# Patient Record
Sex: Male | Born: 1960 | Race: White | Hispanic: No | Marital: Single | Smoking: Never smoker
Health system: Southern US, Community
[De-identification: ages and names within clinical notes are randomized; demographics above are authoritative.]

## PROBLEM LIST (undated history)

## (undated) DIAGNOSIS — C6290 Malignant neoplasm of unspecified testis, unspecified whether descended or undescended: Secondary | ICD-10-CM

## (undated) HISTORY — DX: Malignant neoplasm of unspecified testis, unspecified whether descended or undescended: C62.90

## (undated) HISTORY — PX: TESTICLE SURGERY: SHX794

---

## 2002-01-08 ENCOUNTER — Ambulatory Visit: Admission: RE | Admit: 2002-01-08 | Discharge: 2002-04-08 | Payer: Self-pay | Admitting: Radiation Oncology

## 2011-11-15 ENCOUNTER — Encounter: Payer: Self-pay | Admitting: Gastroenterology

## 2011-11-23 ENCOUNTER — Other Ambulatory Visit (INDEPENDENT_AMBULATORY_CARE_PROVIDER_SITE_OTHER): Payer: 59

## 2011-11-23 ENCOUNTER — Ambulatory Visit (INDEPENDENT_AMBULATORY_CARE_PROVIDER_SITE_OTHER): Payer: 59 | Admitting: Gastroenterology

## 2011-11-23 ENCOUNTER — Encounter: Payer: Self-pay | Admitting: Gastroenterology

## 2011-11-23 DIAGNOSIS — R143 Flatulence: Secondary | ICD-10-CM

## 2011-11-23 DIAGNOSIS — R141 Gas pain: Secondary | ICD-10-CM

## 2011-11-23 DIAGNOSIS — R142 Eructation: Secondary | ICD-10-CM

## 2011-11-23 DIAGNOSIS — Z8547 Personal history of malignant neoplasm of testis: Secondary | ICD-10-CM

## 2011-11-23 DIAGNOSIS — K5900 Constipation, unspecified: Secondary | ICD-10-CM

## 2011-11-23 DIAGNOSIS — R140 Abdominal distension (gaseous): Secondary | ICD-10-CM | POA: Insufficient documentation

## 2011-11-23 LAB — BASIC METABOLIC PANEL
BUN: 14 mg/dL (ref 6–23)
Chloride: 101 mEq/L (ref 96–112)
Creatinine, Ser: 1.2 mg/dL (ref 0.4–1.5)
GFR: 67.63 mL/min (ref >60.00)
Glucose, Bld: 99 mg/dL (ref 70–99)
Potassium: 4.3 mEq/L (ref 3.5–5.1)

## 2011-11-23 LAB — FOLATE: Folate: 6.3 ng/mL (ref >5.9)

## 2011-11-23 LAB — CBC WITH DIFFERENTIAL/PLATELET
Basophils Absolute: 0 10*3/uL (ref 0.0–0.1)
Eosinophils Absolute: 0.3 10*3/uL (ref 0.0–0.7)
Lymphocytes Relative: 14.5 % (ref 12.0–46.0)
MCHC: 35.1 g/dL (ref 30.0–36.0)
Monocytes Relative: 7.3 % (ref 3.0–12.0)
Neutrophils Relative %: 73.7 % (ref 43.0–77.0)
RDW: 13.7 % (ref 11.5–14.6)

## 2011-11-23 LAB — HEPATIC FUNCTION PANEL
AST: 20 U/L (ref 0–37)
Albumin: 4.1 g/dL (ref 3.5–5.2)
Alkaline Phosphatase: 73 U/L (ref 39–117)
Total Protein: 7.2 g/dL (ref 6.0–8.3)

## 2011-11-23 LAB — TSH: TSH: 3.02 u[IU]/mL (ref 0.35–5.50)

## 2011-11-23 LAB — IBC PANEL
Iron: 93 ug/dL (ref 42–165)
Transferrin: 293.9 mg/dL (ref 212.0–360.0)

## 2011-11-23 LAB — VITAMIN B12: Vitamin B-12: 242 pg/mL (ref 211–911)

## 2011-11-23 MED ORDER — PEG-KCL-NACL-NASULF-NA ASC-C 100 G PO SOLR: 1.0000 | Freq: Once | ORAL | Status: DC

## 2011-11-23 NOTE — Progress Notes (Signed)
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 He had no problems with his gastrointestinal tract during his radiation therapy. He currently describes mild constipation relieved with MiraLax and some postprandial gas and bloating with vague abdominal discomfort. He denies any upper gastrointestinal or hepatobiliary complaints. He specifically denies melena, hematochezia, anorexia, weight loss, or any systemic complaints. He has regular followup with urology, and has a family history of prostate cancer. He denies current genitourinary symptomatology. The patient has not had previous colonoscopy or barium enema.  I have reviewed this patient's present history, medical and surgical past history, allergies and medications.     ROS: The remainder of the 10 point ROS is negative     Physical Exam: General well developed well nourished patient in no acute distress, appearing his day-to-day Eyes PERRLA, no icterus, fundoscopic exam per opthamologist Skin no lesions noted Neck supple, no adenopathy, no thyroid enlargement, no tenderness Chest clear to percussion and auscultation Heart no significant murmurs, gallops or rubs noted Abdomen no hepatosplenomegaly masses or tenderness, BS normal. . Extremities no acute joint lesions, edema, phlebitis or evidence of cellulitis. Neurologic patient oriented x 3, cranial nerves intact, no focal neurologic deficits noted. Psychological mental status normal and normal affect.  Assessment and plan: Probable diverticulosis coli, rule out colonic polyposis per his age. I placed him on a high-fiber diet with daily Metamucil and liberal by mouth fluids. Screening labs ordered, and we will schedule outpatient colonoscopy. Information concerning diverticulosis in his management given to the patient. His symptoms do not seem consistent with chronic radiation injury to his  gut, but he may need abdominal CT scan depending on his workup and clinical course.  Encounter Diagnoses  Name Primary?  . Bloating   . Constipation

## 2011-11-23 NOTE — Patient Instructions (Signed)
Your procedure has been scheduled for 12/13/2011, please follow the seperate instructions.  Please go to the basement today for your labs.  Your prescription(s) have been sent to you pharmacy.  Buy metamucil OTC and take once a day. Follow the high fiber diet below.     High Fiber Diet A high fiber diet changes your normal diet to include more whole grains, legumes, fruits, and vegetables. Changes in the diet involve replacing refined carbohydrates with unrefined foods. The calorie level of the diet is essentially unchanged. The Dietary Reference Intake (recommended amount) for adult males is 38 g per day. For adult females, it is 25 g per day. Pregnant and lactating women should consume 28 g of fiber per day. Fiber is the intact part of a plant that is not broken down during digestion. Functional fiber is fiber that has been isolated from the plant to provide a beneficial effect in the body. PURPOSE  Increase stool bulk.   Ease and regulate bowel movements.   Lower cholesterol.  INDICATIONS THAT YOU NEED MORE FIBER  Constipation and hemorrhoids.   Uncomplicated diverticulosis (intestine condition) and irritable bowel syndrome.   Weight management.   As a protective measure against hardening of the arteries (atherosclerosis), diabetes, and cancer.  NOTE OF CAUTION If you have a digestive or bowel problem, ask your caregiver for advice before adding high fiber foods to your diet. Some of the following medical problems are such that a high fiber diet should not be used without consulting your caregiver:  Acute diverticulitis (intestine infection).   Partial small bowel obstructions.   Complicated diverticular disease involving bleeding, rupture (perforation), or abscess (boil, furuncle).   Presence of autonomic neuropathy (nerve damage) or gastric paresis (stomach cannot empty itself).  GUIDELINES FOR INCREASING FIBER  Start adding fiber to the diet slowly. A gradual increase of  about 5 more grams (2 slices of whole-wheat bread, 2 servings of most fruits or vegetables, or 1 bowl of high fiber cereal) per day is best. Too rapid an increase in fiber may result in constipation, flatulence, and bloating.   Drink enough water and fluids to keep your urine clear or pale yellow. Water, juice, or caffeine-free drinks are recommended. Not drinking enough fluid may cause constipation.   Eat a variety of high fiber foods rather than one type of fiber.   Try to increase your intake of fiber through using high fiber foods rather than fiber pills or supplements that contain small amounts of fiber.   The goal is to change the types of food eaten. Do not supplement your present diet with high fiber foods, but replace foods in your present diet.  INCLUDE A VARIETY OF FIBER SOURCES  Replace refined and processed grains with whole grains, canned fruits with fresh fruits, and incorporate other fiber sources. White rice, white breads, and most bakery goods contain little or no fiber.   Brown whole-grain rice, buckwheat oats, and many fruits and vegetables are all good sources of fiber. These include: broccoli, Brussels sprouts, cabbage, cauliflower, beets, sweet potatoes, white potatoes (skin on), carrots, tomatoes, eggplant, squash, berries, fresh fruits, and dried fruits.   Cereals appear to be the richest source of fiber. Cereal fiber is found in whole grains and bran. Bran is the fiber-rich outer coat of cereal grain, which is largely removed in refining. In whole-grain cereals, the bran remains. In breakfast cereals, the largest amount of fiber is found in those with "bran" in their names. The fiber content is  sometimes indicated on the label.   You may need to include additional fruits and vegetables each day.   In baking, for 1 cup white flour, you may use the following substitutions:   1 cup whole-wheat flour minus 2 tbs.    cup white flour plus  cup whole-wheat flour.    Document Released: 10/24/2005 Document Revised: 07/06/2011 Document Reviewed: 09/01/2009 [facility] Patient Information 2012 [place] [facility] [place], [place].   Diverticulitis A diverticulum is a small pouch or sac on the colon. Diverticulosis is the presence of these diverticula on the colon. Diverticulitis is the irritation (inflammation) or infection of diverticula. CAUSES  The colon and its diverticula contain bacteria. If food particles block the tiny opening to a diverticulum, the bacteria inside can grow and cause an increase in pressure. This leads to infection and inflammation and is called diverticulitis. SYMPTOMS   Abdominal pain and tenderness. Usually, the pain is located on the left side of your abdomen. However, it could be located elsewhere.   Fever.   Bloating.   Feeling sick to your stomach (nausea).   Throwing up (vomiting).   Abnormal stools.  DIAGNOSIS  Your caregiver will take a history and perform a physical exam. Since many things can cause abdominal pain, other tests may be necessary. Tests may include:  Blood tests.   Urine tests.   X-ray of the abdomen.   CT scan of the abdomen.  Sometimes, surgery is needed to determine if diverticulitis or other conditions are causing your symptoms. TREATMENT  Most of the time, you can be treated without surgery. Treatment includes:  Resting the bowels by only having liquids for a few days. As you improve, you will need to eat a low-fiber diet.   Intravenous (IV) fluids if you are losing body fluids (dehydrated).   Antibiotic medicines that treat infections may be given.   Pain and nausea medicine, if needed.   Surgery if the inflamed diverticulum has burst.  HOME CARE INSTRUCTIONS   Try a clear liquid diet (broth, tea, or water for as long as directed by your caregiver). You may then gradually begin a low-fiber diet as tolerated. A low-fiber diet is a diet with less than 10 grams of fiber. Choose the foods below to reduce  fiber in the diet:   White breads, cereals, rice, and pasta.   Cooked fruits and vegetables or soft fresh fruits and vegetables without the skin.   Ground or well-cooked tender beef, ham, veal, lamb, pork, or poultry.   Eggs and seafood.   After your diverticulitis symptoms have improved, your caregiver may put you on a high-fiber diet. A high-fiber diet includes 14 grams of fiber for every 1000 calories consumed. For a standard 2000 calorie diet, you would need 28 grams of fiber. Follow these diet guidelines to help you increase the fiber in your diet. It is important to slowly increase the amount fiber in your diet to avoid gas, constipation, and bloating.   Choose whole-grain breads, cereals, pasta, and brown rice.   Choose fresh fruits and vegetables with the skin on. Do not overcook vegetables because the more vegetables are cooked, the more fiber is lost.   Choose more nuts, seeds, legumes, dried peas, beans, and lentils.   Look for food products that have greater than 3 grams of fiber per serving on the Nutrition Facts label.   Take all medicine as directed by your caregiver.   If your caregiver has given you a follow-up appointment, it is very important that  you go. Not going could result in lasting (chronic) or permanent injury, pain, and disability. If there is any problem keeping the appointment, call to reschedule.  SEEK MEDICAL CARE IF:   Your pain does not improve.   You have a hard time advancing your diet beyond clear liquids.   Your bowel movements do not return to normal.  SEEK IMMEDIATE MEDICAL CARE IF:   Your pain becomes worse.   You have an oral temperature above 102 F (38.9 C), not controlled by medicine.   You have repeated vomiting.   You have bloody or black, tarry stools.   Symptoms that brought you to your caregiver become worse or are not getting better.  MAKE SURE YOU:   Understand these instructions.   Will watch your condition.   Will  get help right away if you are not doing well or get worse.  Document Released: 08/03/2005 Document Revised: 07/06/2011 Document Reviewed: 11/29/2010 [facility] Patient Information 2012 [place], [place].

## 2011-11-24 LAB — FERRITIN: Ferritin: 132.9 ng/mL (ref 22.0–322.0)

## 2011-12-12 ENCOUNTER — Telehealth: Payer: Self-pay | Admitting: *Deleted

## 2011-12-12 NOTE — Telephone Encounter (Signed)
Message copied by Leonette Monarch on Mon Dec 12, 2011  1:38 PM ------      Message from: Jarold Motto, DAVID R      Created: Mon Dec 12, 2011 12:37 PM       Start B12 rx.Marland KitchenMarland Kitchen

## 2011-12-13 ENCOUNTER — Ambulatory Visit (AMBULATORY_SURGERY_CENTER): Payer: 59 | Admitting: Gastroenterology

## 2011-12-13 ENCOUNTER — Encounter: Payer: Self-pay | Admitting: Gastroenterology

## 2011-12-13 ENCOUNTER — Other Ambulatory Visit: Payer: Self-pay | Admitting: Gastroenterology

## 2011-12-13 DIAGNOSIS — D128 Benign neoplasm of rectum: Secondary | ICD-10-CM

## 2011-12-13 DIAGNOSIS — Z1211 Encounter for screening for malignant neoplasm of colon: Secondary | ICD-10-CM

## 2011-12-13 DIAGNOSIS — R143 Flatulence: Secondary | ICD-10-CM

## 2011-12-13 DIAGNOSIS — K5900 Constipation, unspecified: Secondary | ICD-10-CM

## 2011-12-13 DIAGNOSIS — R141 Gas pain: Secondary | ICD-10-CM

## 2011-12-13 DIAGNOSIS — D126 Benign neoplasm of colon, unspecified: Secondary | ICD-10-CM

## 2011-12-13 DIAGNOSIS — E538 Deficiency of other specified B group vitamins: Secondary | ICD-10-CM

## 2011-12-13 DIAGNOSIS — D129 Benign neoplasm of anus and anal canal: Secondary | ICD-10-CM

## 2011-12-13 DIAGNOSIS — K5730 Diverticulosis of large intestine without perforation or abscess without bleeding: Secondary | ICD-10-CM

## 2011-12-13 DIAGNOSIS — R140 Abdominal distension (gaseous): Secondary | ICD-10-CM

## 2011-12-13 MED ORDER — CYANOCOBALAMIN 1000 MCG/ML IJ SOLN
1000.0000 ug | INTRAMUSCULAR | Status: AC
  Administered 2011-12-15 – 2011-12-29 (×3): 1000 ug via INTRAMUSCULAR

## 2011-12-13 MED ORDER — SODIUM CHLORIDE 0.9 % IV SOLN: 500.0000 mL | INTRAVENOUS | Status: DC

## 2011-12-13 NOTE — Patient Instructions (Signed)
Please read the handouts given to you by your recovery room nurse.   You need to increase the fiber in your diet.  Your polyp result will be mailed to your home within two weeks.    You may resume your routine medications today.   If you have any questions, call at [phone].  Thanlk-you.

## 2011-12-13 NOTE — Telephone Encounter (Signed)
Pt aware.

## 2011-12-13 NOTE — Op Note (Signed)
[facility] [address] [place], [place]  [address]  COLONOSCOPY PROCEDURE REPORT  PATIENT:  Seth Greer, Seth Greer  MR#:  [id number] BIRTHDATE:  1961-07-12, 50 yrs. old  GENDER:  male ENDOSCOPIST:  Vania Rea. Jarold Motto, MD, [facility] REF. BY:  Evelena Peat, M.D. PROCEDURE DATE:  12/13/2011 PROCEDURE:  Colonoscopy with biopsy ASA CLASS:  Class I INDICATIONS:  Routine Risk Screening, constipation MEDICATIONS:   propofol (Diprivan) 300 mg IV  DESCRIPTION OF PROCEDURE:   After the risks and benefits and of the procedure were explained, informed consent was obtained. Digital rectal exam was performed and revealed no abnormalities. The LB CF-[id number] endoscope was introduced through the anus and advanced to the cecum, which was identified by both the appendix and ileocecal valve.  The quality of the prep was excellent, using MoviPrep.  The instrument was then slowly withdrawn as the colon was fully examined. <<PROCEDUREIMAGES>>  FINDINGS:  A diminutive polyp was found at the appendiceal orifice. SMALL NODULE BIOPSIED AT APPENDICEAL OEIFICE AREA. Scattered diverticula were found DESCENDING & SIGMOID COLON  This was otherwise a normal examination of the colon.   Retroflexed views in the rectum revealed no abnormalities.    The scope was then withdrawn from the patient and the procedure completed.  COMPLICATIONS:  None ENDOSCOPIC IMPRESSION: 1) Diminutive polyp at the appendiceal orifice 2) Diverticula, scattered in the DESCENDING & SIGMOID COLON 3) Otherwise normal examination R/O CECAL ADENOMA VSNORMAL TISSUE. RECOMMENDATIONS: 1) High fiber diet 2) metamucil or benefiber 3) Repeat colonoscopy in 5 years if polyp adenomatous; otherwise 10 years  REPEAT EXAM:  No  ______________________________ Vania Rea. Jarold Motto, MD, [facility]  CC:  n. eSIGNED:   Vania Rea. Patterson at 12/13/2011 03:04 PM  Hopple, Sharlot Gowda, [id number]

## 2011-12-13 NOTE — Progress Notes (Signed)
Patient did not have preoperative order for IV antibiotic SSI prophylaxis. (G8918)  Patient did not experience any of the following events: a burn prior to discharge; a fall within the facility; wrong site/side/patient/procedure/implant event; or a hospital transfer or hospital admission upon discharge from the facility. (G8907)  

## 2011-12-14 ENCOUNTER — Telehealth: Payer: Self-pay

## 2011-12-14 DIAGNOSIS — Z1211 Encounter for screening for malignant neoplasm of colon: Secondary | ICD-10-CM | POA: Insufficient documentation

## 2011-12-14 NOTE — Telephone Encounter (Signed)
Left message on answering machine. 

## 2011-12-15 ENCOUNTER — Ambulatory Visit (INDEPENDENT_AMBULATORY_CARE_PROVIDER_SITE_OTHER): Payer: 59 | Admitting: Gastroenterology

## 2011-12-15 DIAGNOSIS — E538 Deficiency of other specified B group vitamins: Secondary | ICD-10-CM

## 2011-12-19 ENCOUNTER — Encounter: Payer: Self-pay | Admitting: Gastroenterology

## 2011-12-22 ENCOUNTER — Ambulatory Visit (INDEPENDENT_AMBULATORY_CARE_PROVIDER_SITE_OTHER): Payer: 59 | Admitting: Gastroenterology

## 2011-12-22 DIAGNOSIS — E538 Deficiency of other specified B group vitamins: Secondary | ICD-10-CM

## 2011-12-22 MED ORDER — ZOLPIDEM TARTRATE 10 MG PO TABS: 10.0000 mg | ORAL_TABLET | Freq: Every evening | ORAL | Status: AC | PRN

## 2011-12-22 NOTE — Progress Notes (Signed)
Per Dr Jarold Motto one month Seth Greer can be sent then he needs to get it from his primary care MD

## 2011-12-29 ENCOUNTER — Ambulatory Visit (INDEPENDENT_AMBULATORY_CARE_PROVIDER_SITE_OTHER): Payer: 59 | Admitting: Gastroenterology

## 2011-12-29 DIAGNOSIS — E538 Deficiency of other specified B group vitamins: Secondary | ICD-10-CM

## 2012-01-18 ENCOUNTER — Other Ambulatory Visit: Payer: Self-pay | Admitting: Gastroenterology

## 2012-01-18 DIAGNOSIS — E538 Deficiency of other specified B group vitamins: Secondary | ICD-10-CM

## 2012-01-18 MED ORDER — CYANOCOBALAMIN 1000 MCG/ML IJ SOLN
1000.0000 ug | INTRAMUSCULAR | Status: DC
  Administered 2012-01-26 – 2012-05-29 (×5): 1000 ug via INTRAMUSCULAR

## 2012-01-26 ENCOUNTER — Ambulatory Visit (INDEPENDENT_AMBULATORY_CARE_PROVIDER_SITE_OTHER): Payer: 59 | Admitting: Gastroenterology

## 2012-01-26 DIAGNOSIS — E538 Deficiency of other specified B group vitamins: Secondary | ICD-10-CM

## 2012-02-21 ENCOUNTER — Ambulatory Visit (INDEPENDENT_AMBULATORY_CARE_PROVIDER_SITE_OTHER): Payer: 59 | Admitting: Gastroenterology

## 2012-02-21 DIAGNOSIS — E538 Deficiency of other specified B group vitamins: Secondary | ICD-10-CM

## 2012-03-23 ENCOUNTER — Ambulatory Visit (INDEPENDENT_AMBULATORY_CARE_PROVIDER_SITE_OTHER): Payer: 59 | Admitting: Gastroenterology

## 2012-03-23 DIAGNOSIS — E538 Deficiency of other specified B group vitamins: Secondary | ICD-10-CM

## 2012-04-26 ENCOUNTER — Ambulatory Visit (INDEPENDENT_AMBULATORY_CARE_PROVIDER_SITE_OTHER): Payer: 59 | Admitting: Gastroenterology

## 2012-04-26 DIAGNOSIS — E538 Deficiency of other specified B group vitamins: Secondary | ICD-10-CM

## 2012-05-29 ENCOUNTER — Ambulatory Visit (INDEPENDENT_AMBULATORY_CARE_PROVIDER_SITE_OTHER): Payer: 59 | Admitting: Gastroenterology

## 2012-05-29 DIAGNOSIS — E538 Deficiency of other specified B group vitamins: Secondary | ICD-10-CM

## 2012-06-29 ENCOUNTER — Encounter: Payer: 59 | Admitting: Gastroenterology

## 2012-07-30 ENCOUNTER — Ambulatory Visit (INDEPENDENT_AMBULATORY_CARE_PROVIDER_SITE_OTHER): Payer: 59 | Admitting: Gastroenterology

## 2012-07-30 DIAGNOSIS — E538 Deficiency of other specified B group vitamins: Secondary | ICD-10-CM

## 2012-07-30 MED ORDER — CYANOCOBALAMIN 1000 MCG/ML IJ SOLN
1000.0000 ug | Freq: Once | INTRAMUSCULAR | Status: AC
  Administered 2012-07-30: 1000 ug via INTRAMUSCULAR

## 2012-08-30 ENCOUNTER — Ambulatory Visit (INDEPENDENT_AMBULATORY_CARE_PROVIDER_SITE_OTHER): Payer: 59 | Admitting: Gastroenterology

## 2012-08-30 DIAGNOSIS — E538 Deficiency of other specified B group vitamins: Secondary | ICD-10-CM

## 2012-08-30 MED ORDER — CYANOCOBALAMIN 1000 MCG/ML IJ SOLN
1000.0000 ug | Freq: Once | INTRAMUSCULAR | Status: AC
  Administered 2012-10-02: 1000 ug via INTRAMUSCULAR

## 2012-10-02 ENCOUNTER — Ambulatory Visit (INDEPENDENT_AMBULATORY_CARE_PROVIDER_SITE_OTHER): Payer: 59 | Admitting: Gastroenterology

## 2012-10-02 ENCOUNTER — Other Ambulatory Visit: Payer: Self-pay | Admitting: Gastroenterology

## 2012-10-02 DIAGNOSIS — E538 Deficiency of other specified B group vitamins: Secondary | ICD-10-CM

## 2012-11-02 ENCOUNTER — Ambulatory Visit (INDEPENDENT_AMBULATORY_CARE_PROVIDER_SITE_OTHER): Payer: 59 | Admitting: Gastroenterology

## 2012-11-02 ENCOUNTER — Telehealth: Payer: Self-pay | Admitting: Gastroenterology

## 2012-11-02 DIAGNOSIS — E538 Deficiency of other specified B group vitamins: Secondary | ICD-10-CM

## 2012-11-02 MED ORDER — CYANOCOBALAMIN 1000 MCG/ML IJ SOLN: 1000.0000 ug | Freq: Once | INTRAMUSCULAR | Status: AC

## 2012-11-02 NOTE — Telephone Encounter (Signed)
Informed pt I calculate his last injection will be 12/2012; I will call him to have labs drawn in March, 2014; pt stated understanding.

## 2012-12-07 ENCOUNTER — Telehealth: Payer: Self-pay | Admitting: *Deleted

## 2012-12-07 ENCOUNTER — Ambulatory Visit (INDEPENDENT_AMBULATORY_CARE_PROVIDER_SITE_OTHER): Payer: 59 | Admitting: Gastroenterology

## 2012-12-07 DIAGNOSIS — E538 Deficiency of other specified B group vitamins: Secondary | ICD-10-CM

## 2012-12-07 DIAGNOSIS — Z923 Personal history of irradiation: Secondary | ICD-10-CM

## 2012-12-07 DIAGNOSIS — Z8547 Personal history of malignant neoplasm of testis: Secondary | ICD-10-CM

## 2012-12-07 MED ORDER — CYANOCOBALAMIN 1000 MCG/ML IJ SOLN
1000.0000 ug | Freq: Once | INTRAMUSCULAR | Status: AC
  Administered 2012-12-07: 1000 ug via INTRAMUSCULAR

## 2012-12-07 NOTE — Progress Notes (Signed)
Patient reminded to come in March for his Vitamin B-12 level to be drawn.  He is requesting we draw all labs like done last year.  I will forward this to Aram Beecham, RN to see if we may add these.

## 2012-12-07 NOTE — Telephone Encounter (Signed)
Labs are ok to be done   ----- Message -----   From: Linna Hoff, RN   Sent: 12/07/2012 1:49 PM   To: Mardella Layman, MD      Dr Jarold Motto saw you 1//16/13 and then had COLON showing hyperplastic polyp. Hx of testicular cancer with radiation and he's been receiving B12 from [place].   Pt is due a B12 level in march and he would like all his labs repeated: B12, BMET, TSH, Hepatic Panel, Ferritin, CBC, Folate, IBC Panel.   Pt would like these repeated; OK? Thanks.    ----- Message -----   From: Patti E [place], CMA   Sent: 12/07/2012 11:09 AM   To: Linna Hoff, RN      Pt requesting lab orders be put in to "check everything" like done last year, coming in March for Vit B-12 level to be drawn.         Informed pt that Dr Jarold Motto OK'd him to have the labs repeated. Pt stated understanding and will come in March.      Attached Reports     The sender attached the following reports to this message:

## 2013-01-08 ENCOUNTER — Other Ambulatory Visit (INDEPENDENT_AMBULATORY_CARE_PROVIDER_SITE_OTHER): Payer: 59

## 2013-01-08 DIAGNOSIS — Z923 Personal history of irradiation: Secondary | ICD-10-CM

## 2013-01-08 DIAGNOSIS — Z8547 Personal history of malignant neoplasm of testis: Secondary | ICD-10-CM

## 2013-01-08 DIAGNOSIS — E538 Deficiency of other specified B group vitamins: Secondary | ICD-10-CM

## 2013-01-08 DIAGNOSIS — Z5189 Encounter for other specified aftercare: Secondary | ICD-10-CM

## 2013-01-08 LAB — IBC PANEL
Iron: 119 ug/dL (ref 42–165)
Saturation Ratios: 32.3 % (ref 20.0–50.0)
Transferrin: 263.4 mg/dL (ref 212.0–360.0)

## 2013-01-08 LAB — HEPATIC FUNCTION PANEL
Albumin: 4 g/dL (ref 3.5–5.2)
Total Protein: 7.7 g/dL (ref 6.0–8.3)

## 2013-01-08 LAB — CBC WITH DIFFERENTIAL/PLATELET
Basophils Absolute: 0 10*3/uL (ref 0.0–0.1)
Basophils Relative: 0.3 % (ref 0.0–3.0)
Eosinophils Absolute: 0.1 10*3/uL (ref 0.0–0.7)
Hemoglobin: 15.9 g/dL (ref 13.0–17.0)
Lymphocytes Relative: 14.3 % (ref 12.0–46.0)
Monocytes Relative: 4.8 % (ref 3.0–12.0)
Neutro Abs: 5.3 10*3/uL (ref 1.4–7.7)
Neutrophils Relative %: 78.5 % — ABNORMAL HIGH (ref 43.0–77.0)
RBC: 4.88 Mil/uL (ref 4.22–5.81)
RDW: 13.3 % (ref 11.5–14.6)

## 2013-01-08 LAB — BASIC METABOLIC PANEL
BUN: 11 mg/dL (ref 6–23)
Creatinine, Ser: 1.1 mg/dL (ref 0.4–1.5)
GFR: 71.44 mL/min (ref >60.00)
Glucose, Bld: 150 mg/dL — ABNORMAL HIGH (ref 70–99)

## 2013-01-08 LAB — TSH: TSH: 1.75 u[IU]/mL (ref 0.35–5.50)

## 2013-01-08 LAB — FERRITIN: Ferritin: 175.3 ng/mL (ref 22.0–322.0)

## 2013-01-14 ENCOUNTER — Encounter: Payer: Self-pay | Admitting: Gastroenterology

## 2013-01-14 ENCOUNTER — Ambulatory Visit (INDEPENDENT_AMBULATORY_CARE_PROVIDER_SITE_OTHER)
Admission: RE | Admit: 2013-01-14 | Discharge: 2013-01-14 | Disposition: A | Discharge status: Home / Self Care | Payer: 59 | Source: Ambulatory Visit | Attending: Gastroenterology | Admitting: Gastroenterology

## 2013-01-14 ENCOUNTER — Ambulatory Visit
Admission: RE | Admit: 2013-01-14 | Discharge: 2013-01-14 | Disposition: A | Discharge status: Home / Self Care | Payer: 59 | Source: Ambulatory Visit | Attending: Gastroenterology | Admitting: Gastroenterology

## 2013-01-14 ENCOUNTER — Ambulatory Visit (INDEPENDENT_AMBULATORY_CARE_PROVIDER_SITE_OTHER)
Admission: RE | Admit: 2013-01-14 | Discharge: 2013-01-14 | Disposition: A | Discharge status: Home / Self Care | Payer: 59 | Source: Ambulatory Visit

## 2013-01-14 ENCOUNTER — Other Ambulatory Visit (INDEPENDENT_AMBULATORY_CARE_PROVIDER_SITE_OTHER): Payer: 59

## 2013-01-14 ENCOUNTER — Ambulatory Visit (INDEPENDENT_AMBULATORY_CARE_PROVIDER_SITE_OTHER): Payer: 59 | Admitting: Gastroenterology

## 2013-01-14 VITALS — BP 108/78 | HR 75 | Ht 74.5 in | Wt 197.1 lb

## 2013-01-14 DIAGNOSIS — Z8547 Personal history of malignant neoplasm of testis: Secondary | ICD-10-CM

## 2013-01-14 DIAGNOSIS — R109 Unspecified abdominal pain: Secondary | ICD-10-CM

## 2013-01-14 DIAGNOSIS — R079 Chest pain, unspecified: Secondary | ICD-10-CM

## 2013-01-14 MED ORDER — CELECOXIB 200 MG PO CAPS: 200.0000 mg | ORAL_CAPSULE | Freq: Every day | ORAL | Status: DC

## 2013-01-14 MED ORDER — IOHEXOL 300 MG/ML  SOLN
100.0000 mL | Freq: Once | INTRAMUSCULAR | Status: AC | PRN
  Administered 2013-01-14: 100 mL via INTRAVENOUS

## 2013-01-14 NOTE — Progress Notes (Signed)
This is a  52 year old Caucasian male who continues with crampy diffuse abdominal pain, also pain in his right posterior chest area without real precipitating or alleviating elements.  His chest pain is made worse by sitting in one position for extensive periods.  Recent colonoscopy was unremarkable except for mild diverticulosis.  He is status post surgery and radiation therapy for testicular cancer in 2003.  His mild constipation is improved with MiraLax but he continues with gas, bloating, and crampy abdominal pain without specific food intolerances.  There is been no anorexia, weight loss, fever or chills.  Lab data was all unremarkable except for slightly elevated blood sugar of 150.  He denies any cardiopulmonary or other general medical or genitourinary symptoms.  Current Medications, Allergies, Past Medical History, Past Surgical History, Family History and Social History were reviewed in [employer] record.  ROS: All systems were reviewed and are negative unless otherwise stated in the HPI.          Physical Exam: Blood pressure 130/78, pulse 75 and regular, and weight under 97 with a BMI of 24.98.  I cannot appreciate stigmata of chronic liver disease.  His chest is clear to P&A, he is in a regular rhythm without murmurs gallops or rubs, and there is no abdominal distention, organomegaly, masses or tenderness.  Bowel sounds are normal.  Mental status is normal.    Assessment and Plan: Right posterior chest pain ; probably musculoskeletal in nature.  His abdominal pain is most consistent with irritable bowel syndrome.  I have ordered a hemoglobin A1c, and go ahead with the CT scan of his chest and abdomen per his history of testicular cancer and previous radiation therapy.  He is currently on a probiotic trial.  I prescribed Celebrex 200 mg a day pending further evaluation.  In the interim, I have asked him to continue a high-fiber diet as tolerated, liberal by mouth  fluids, and when necessary MiraLax.  Encounter Diagnoses  Name Primary?  . H/O testicular cancer Yes  . Abdominal  pain, other specified site   . Chest pain, unspecified

## 2013-01-14 NOTE — Patient Instructions (Addendum)
You have been scheduled for a CT scan of the abdomen and pelvis at [facility] ([address]---this is in the same building as [facility]).   You are scheduled on 01-14-2013 at 4 PM. You should arrive 15 minutes prior to your appointment time for registration. Please follow the written instructions below on the day of your exam:  WARNING: IF YOU ARE ALLERGIC TO IODINE/X-RAY DYE, PLEASE NOTIFY RADIOLOGY IMMEDIATELY AT [phone]! YOU WILL BE GIVEN A 13 HOUR PREMEDICATION PREP.  1) Do not eat or drink anything after 12 PM (4 hours prior to your test) 2) You have been given 2 bottles of oral contrast to drink. The solution may taste better if refrigerated, but do NOT add ice or any other liquid to this solution. Shake well before drinking.    Drink 1 bottle of contrast @ 2PM (2 hours prior to your exam)  Drink 1 bottle of contrast @ 3PM (1 hour prior to your exam)  You may take any medications as prescribed with a small amount of water except for the following: Metformin, Glucophage, Glucovance, Avandamet, Riomet, Fortamet, Actoplus Met, Janumet, Glumetza or Metaglip. The above medications must be held the day of the exam AND 48 hours after the exam.  The purpose of you drinking the oral contrast is to aid in the visualization of your intestinal tract. The contrast solution may cause some diarrhea. Before your exam is started, you will be given a small amount of fluid to drink. Depending on your individual set of symptoms, you may also receive an intravenous injection of x-ray contrast/dye. Plan on being at [facility] for 30 minutes or long, depending on the type of exam you are having performed.  This test typically takes 30-45 minutes to complete.  If you have any questions regarding your exam or if you need to reschedule, you may call the CT department at [phone] between the hours of 8:00 am and 5:00 pm,  Monday-Friday.  _____________________________________________________________________________________________________________________  Your physician has requested that you go to the basement for the following lab work before leaving today: HgB A1c   We have sent the following medications to your pharmacy for you to pick up at your convenience: Celebrex, please take as directed

## 2013-01-14 NOTE — Addendum Note (Signed)
Addended by: Ok Anis A on: 01/14/2013 12:05 PM   Modules accepted: Orders

## 2013-01-15 ENCOUNTER — Telehealth: Payer: Self-pay | Admitting: *Deleted

## 2013-01-15 DIAGNOSIS — R911 Solitary pulmonary nodule: Secondary | ICD-10-CM

## 2013-01-15 NOTE — Telephone Encounter (Signed)
Informed pt of results of CT and Dr Jarold Motto and the need for a pulmonary referral; I will call him tomorrow with the appt. Pt states 01/22/13 is good for him because he travels.

## 2013-01-16 NOTE — Telephone Encounter (Signed)
Informed pt of his appt with Dr Craige Cotta on 01/31/13 at 3pm; gave him his number in case he has to r/s. Pt asked what to do about his abdominal pain. He states he has not tried the Celebrex; he will try that and if his pain continues, he will call me back. He did receive the email of his scan.

## 2013-01-31 ENCOUNTER — Encounter: Payer: Self-pay | Admitting: Pulmonary Disease

## 2013-01-31 ENCOUNTER — Ambulatory Visit (INDEPENDENT_AMBULATORY_CARE_PROVIDER_SITE_OTHER): Payer: 59 | Admitting: Pulmonary Disease

## 2013-01-31 VITALS — BP 120/70 | HR 94 | Temp 98.3°F | Ht 74.75 in | Wt 201.2 lb

## 2013-01-31 DIAGNOSIS — R911 Solitary pulmonary nodule: Secondary | ICD-10-CM | POA: Insufficient documentation

## 2013-01-31 NOTE — Progress Notes (Signed)
  Subjective:    Patient ID: Seth Greer, male    DOB: Apr 23, 1961, 52 y.o.   MRN: [id number]  HPI    Review of Systems  Constitutional: Negative for fever and unexpected weight change.  HENT: Positive for congestion. Negative for ear pain, nosebleeds, sore throat, rhinorrhea, sneezing, trouble swallowing, dental problem, postnasal drip and sinus pressure.   Eyes: Negative for redness and itching.  Respiratory: Negative for cough, chest tightness, shortness of breath and wheezing.   Cardiovascular: Negative for palpitations and leg swelling.  Gastrointestinal: Positive for abdominal pain. Negative for nausea and vomiting.  Genitourinary: Negative for dysuria.  Musculoskeletal: Negative for joint swelling.  Skin: Negative for rash.  Neurological: Negative for headaches.  Hematological: Does not bruise/bleed easily.  Psychiatric/Behavioral: Negative for dysphoric mood. The patient is not nervous/anxious.        Objective:   Physical Exam        Assessment & Plan:

## 2013-01-31 NOTE — Patient Instructions (Signed)
Will schedule CT chest for September 2014 Follow up after CT chest in September 2014

## 2013-01-31 NOTE — Progress Notes (Signed)
Chief Complaint  Patient presents with  . Advice Only    Ref By Dr.Patterson-- CT scan done after having stomach pains-- spots found on lungs-- denies any sx    History of Present Illness: Seth Greer is a 52 y.o. male for evaluation of lung nodule.  He was diagnosed with testicular cancer in 2003.  He had surgery and prophylactic XRT to his mid chest.  He is followed by Dr. Charlsie Merles with [facility].  His last CT chest was in 2005 or 2006.  He has been followed by Dr. Jarold Motto for his GI issues.  During this evaluation he had CT chest and abdomen.  He was found to have small RML nodule, and referred to pulmonary for further assessment.  He has never smoked, and denies 2nd hand tobacco exposure.  He denies occupational exposure.  He is from [place].  There is no history of pneumonia or exposure to tuberculosis.  He denies animal exposures.  He denies cough, wheeze, sputum, hemoptysis, chest pain, fever, sweats, weight loss, joint swelling, or skin rashes. He does not feel like his breathing causes any problems.  Tests: CT chest 01/08/13 >> 4 mm RML nodule  Seth Greer  has a past medical history of Testicular cancer.  Seth Greer  has past surgical history that includes Testicle surgery.  Prior to Admission medications   Medication Sig Start Date End Date Taking? Authorizing Provider  diphenhydrAMINE (BENADRYL) 25 mg capsule Take 25 mg by mouth at bedtime as needed for itching.   Yes Historical Provider, MD  ibuprofen (ADVIL,MOTRIN) 200 MG tablet Take 200 mg by mouth as needed.   Yes Historical Provider, MD  Probiotic Product (PROBIOTIC FORMULA PO) Take 1 tablet by mouth daily.   Yes Historical Provider, MD  zolpidem (AMBIEN) 10 MG tablet Take 1 tablet (10 mg total) by mouth at bedtime as needed for sleep. 12/22/11 01/14/13  Mardella Layman, MD    No Known Allergies  family history includes Ovarian cancer in his mother and Prostate cancer in his father.   reports  that he has never smoked. He has never used smokeless tobacco. He reports that he drinks about 7.2 ounces of alcohol per week. He reports that he does not use illicit drugs.   Physical Exam:  General - No distress ENT - No sinus tenderness, no oral exudate, no LAN, no thyromegaly, TM clear, pupils equal/reactive Cardiac - s1s2 regular, no murmur, pulses symmetric Chest - No wheeze/rales/dullness, good air entry, normal respiratory excursion Back - No focal tenderness Abd - Soft, non-tender, no organomegaly, + bowel sounds Ext - No edema Neuro - Normal strength, cranial nerves intact Skin - No rashes Psych - Normal mood, and behavior.  01/14/2013  *RADIOLOGY REPORT*   Clinical Data:  Mid abdominal pain.  Chest pain.  History of testicular cancer.   CT CHEST WITH CONTRAST  Technique:  Multidetector CT imaging of the chest, abdomen and pelvis was performed following the standard protocol during bolus administration of intravenous contrast.  Contrast: OMNIPAQUE IOHEXOL 300 MG/ML  SOLN,   Comparison:   None.   Findings:  No pathologic thoracic adenopathy.  Cardiac contours normal.  Thoracic aorta and branch vessels unremarkable.  Mild biapical pleuroparenchymal scarring noted.  4 mm right middle lobe nodule, image 42 of series 3.  Mild thoracic spondylosis noted.   IMPRESSION:  1.  4 mm right middle lobe pulmonary nodule.  Fleischner guidelines do not apply due to the history  of testicular cancer.  Although likely benign, CT of the chest follow-up in 6-12 months may be warranted to ensure lack of change.   Lab Results  Component Value Date   WBC 6.8 01/08/2013   HGB 15.9 01/08/2013   HCT 45.3 01/08/2013   MCV 92.9 01/08/2013   PLT 234.0 01/08/2013    Lab Results  Component Value Date   CREATININE 1.1 01/08/2013   BUN 11 01/08/2013   NA 139 01/08/2013   K 4.3 01/08/2013   CL 102 01/08/2013   CO2 29 01/08/2013    Lab Results  Component Value Date   ALT 20 01/08/2013   AST 18 01/08/2013   ALKPHOS  68 01/08/2013   BILITOT 1.3* 01/08/2013    Lab Results  Component Value Date   TSH 1.75 01/08/2013    Assessment/Plan:  Coralyn Helling, MD [facility]/Sleep Pager:  [phone] 01/31/2013, 3:18 PM

## 2013-01-31 NOTE — Assessment & Plan Note (Signed)
He has incidental finding of small lung nodule in RML.  My suspicion for malignancy is low given no history of smoking.  However, he does have history of testicular cancer.  Will arrange for repeat non contrast CT chest for September 2014, and follow up after this.

## 2013-07-09 ENCOUNTER — Telehealth: Payer: Self-pay | Admitting: Pulmonary Disease

## 2013-07-09 ENCOUNTER — Ambulatory Visit (INDEPENDENT_AMBULATORY_CARE_PROVIDER_SITE_OTHER)
Admission: RE | Admit: 2013-07-09 | Discharge: 2013-07-09 | Disposition: A | Discharge status: Home / Self Care | Payer: 59 | Source: Ambulatory Visit | Attending: Pulmonary Disease | Admitting: Pulmonary Disease

## 2013-07-09 DIAGNOSIS — R911 Solitary pulmonary nodule: Secondary | ICD-10-CM

## 2013-07-09 NOTE — Telephone Encounter (Signed)
Ct Chest Wo Contrast  07/09/2013    CLINICAL DATA:  Six-month followup of lung nodules. History of testicular cancer. Dry cough.   EXAM: CT CHEST WITHOUT CONTRAST   TECHNIQUE: Multidetector CT imaging of the chest was performed following the standard protocol without IV contrast.   COMPARISON:  01/14/2013   FINDINGS:  Stable 0.4 x 0.3 cm right middle lobe pulmonary nodule is noncalcified. No additional nodules observed. Minimal biapical pleural parenchymal scarring. No thoracic adenopathy or additional significant findings.   IMPRESSION:  1. 3 x 4 mm right middle lobe pulmonary nodule, unchanged from 01/14/2013. This documents 6 months of stability. This lesion is statistically highly likely to be benign. Given the history of testicular malignancy, CT chest without contrast an 12-18 months should be considered to firmly established long-term stability. Alternatively, if the patient has remote imaging through this region which could be provided, we are happy to compare for stability.    Electronically Signed   By: Herbie Baltimore   On: 07/09/2013 14:29    Will have my nurse inform pt that CT chest results are stable, and he needs next available ROV to review results in more detail.

## 2013-07-10 NOTE — Telephone Encounter (Signed)
LMTCB x 1 

## 2013-07-10 NOTE — Telephone Encounter (Signed)
I spoke with patient about results and he verbalized understanding and had no questions appt scheduled 

## 2013-07-10 NOTE — Telephone Encounter (Signed)
Pt returned Lindsay's call.  Antionette Fairy

## 2013-07-11 ENCOUNTER — Ambulatory Visit (INDEPENDENT_AMBULATORY_CARE_PROVIDER_SITE_OTHER): Payer: 59 | Admitting: Pulmonary Disease

## 2013-07-11 ENCOUNTER — Encounter: Payer: Self-pay | Admitting: Pulmonary Disease

## 2013-07-11 VITALS — BP 132/88 | HR 79 | Temp 97.1°F | Ht 75.0 in | Wt 208.0 lb

## 2013-07-11 DIAGNOSIS — J4520 Mild intermittent asthma, uncomplicated: Secondary | ICD-10-CM | POA: Insufficient documentation

## 2013-07-11 DIAGNOSIS — R911 Solitary pulmonary nodule: Secondary | ICD-10-CM

## 2013-07-11 DIAGNOSIS — R05 Cough: Secondary | ICD-10-CM

## 2013-07-11 NOTE — Assessment & Plan Note (Signed)
He has recent development of cough likely related to allergies.  He is to continue OTC anti-histamines.  Advised him to call if his symptoms progress.

## 2013-07-11 NOTE — Progress Notes (Signed)
Chief Complaint  Patient presents with  . Follow-up    Review CT results    History of Present Illness: Seth Greer is a 52 y.o. male never smoker with pulmonary nodule and hx of testicular cancer.  He is here to review CT chest.  He was doing well until one week ago.  He was exposed to air conditioner and then got sinus congestion with post nasal drip.  He then developed a dry cough.  He denies sputum fever, sinus pain, chest pain, chest tightness, gland swelling, or skin rash.  He has been using benadryl at night and this helps.  TESTS: CT chest 01/08/13 >> 4 mm RML nodule CT chest 07/09/13 >> no change RML nodule  Seth Greer  has a past medical history of Testicular cancer.  Seth Greer  has past surgical history that includes Testicle surgery.  Prior to Admission medications   Medication Sig Start Date End Date Taking? Authorizing Provider  diphenhydrAMINE (BENADRYL) 25 mg capsule Take 25 mg by mouth at bedtime as needed for itching.   Yes Historical Provider, MD  ibuprofen (ADVIL,MOTRIN) 200 MG tablet Take 200 mg by mouth as needed.   Yes Historical Provider, MD  Probiotic Product (PROBIOTIC FORMULA PO) Take 1 tablet by mouth daily.   Yes Historical Provider, MD  zolpidem (AMBIEN) 10 MG tablet Take 1 tablet (10 mg total) by mouth at bedtime as needed for sleep. 12/22/11 07/11/13 Yes Mardella Layman, MD    No Known Allergies   Physical Exam:  General - No distress ENT - No sinus tenderness, no oral exudate, no LAN Cardiac - s1s2 regular, no murmur Chest - No wheeze/rales/dullness Back - No focal tenderness Abd - Soft, non-tender Ext - No edema Neuro - Normal strength Skin - No rashes Psych - normal mood, and behavior  Ct Chest Wo Contrast  07/09/2013    CLINICAL DATA:  Six-month followup of lung nodules. History of testicular cancer. Dry cough.   EXAM: CT CHEST WITHOUT CONTRAST   TECHNIQUE: Multidetector CT imaging of the chest was performed following the standard  protocol without IV contrast.   COMPARISON:  01/14/2013   FINDINGS:  Stable 0.4 x 0.3 cm right middle lobe pulmonary nodule is noncalcified. No additional nodules observed. Minimal biapical pleural parenchymal scarring. No thoracic adenopathy or additional significant findings.   IMPRESSION:  1. 3 x 4 mm right middle lobe pulmonary nodule, unchanged from 01/14/2013. This documents 6 months of stability. This lesion is statistically highly likely to be benign. Given the history of testicular malignancy, CT chest without contrast an 12-18 months should be considered to firmly established long-term stability. Alternatively, if the patient has remote imaging through this region which could be provided, we are happy to compare for stability.    Electronically Signed   By: Herbie Baltimore   On: 07/09/2013 14:29   Assessment/Plan:  Coralyn Helling, MD [facility]/Sleep Pager:  [phone]

## 2013-07-11 NOTE — Assessment & Plan Note (Signed)
Radiographic appearance is re-assuring.  He will need follow up non contrast CT chest for September 2015.

## 2013-07-11 NOTE — Patient Instructions (Signed)
Will schedule CT chest for September 2015 Follow up after CT chest in September 2015

## 2013-07-17 ENCOUNTER — Other Ambulatory Visit: Payer: 59

## 2013-11-15 IMAGING — CT CT CHEST W/O CM
2 of 4 series · 15 of 36 positions shown, 18 images · IV contrast (Omnipaque 300)
Comparison: 01/14/2013

CLINICAL DATA: Six-month followup of lung nodules. History of
testicular cancer. Dry cough.

EXAM:
CT CHEST WITHOUT CONTRAST
TECHNIQUE: Multidetector CT imaging of the chest was performed following the
standard protocol without IV contrast.

[Series 2: chest routine with · axial · 0.71mm/px · z∈[-298,-34]mm · 12 of 63 slices shown, 15 images]
[im 5/63  mediastinal]
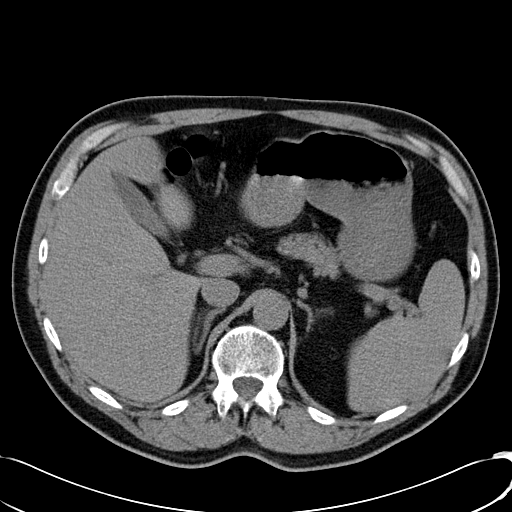
[im 5/63  lung]
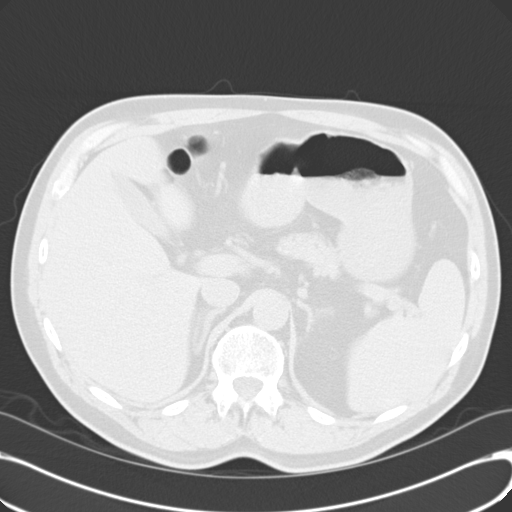
[im 10/63  lung]
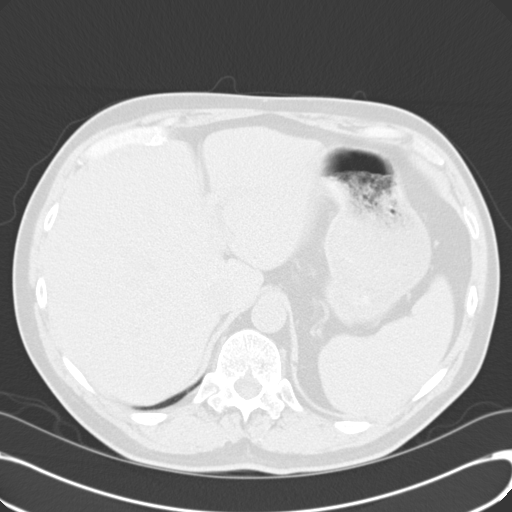
[im 15/63  lung]
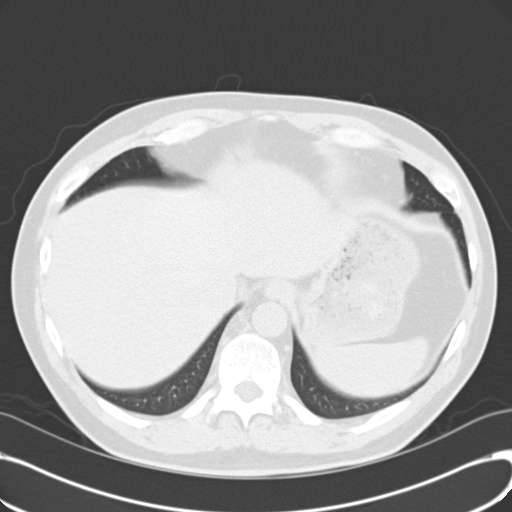
[im 20/63  lung]
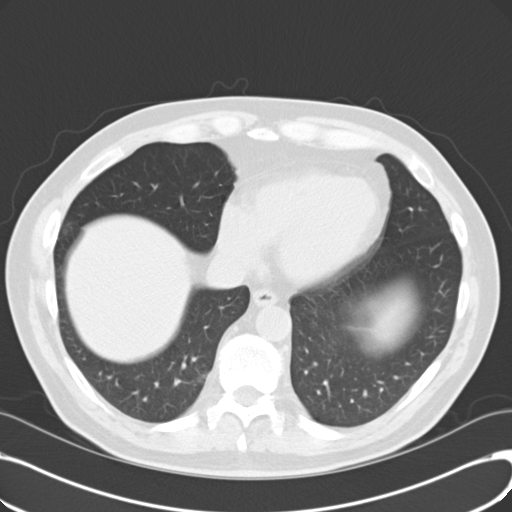
[im 24/63  mediastinal]
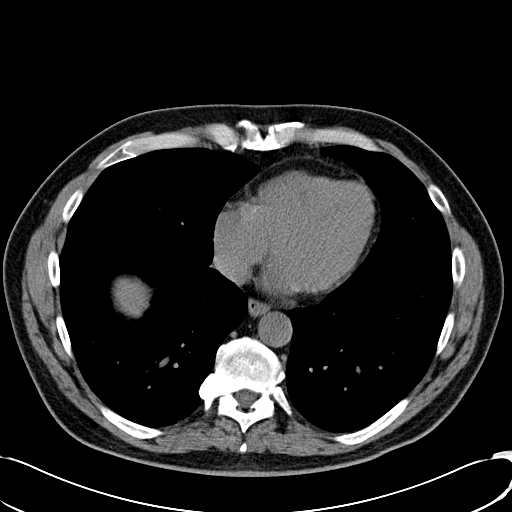
[im 24/63  lung]
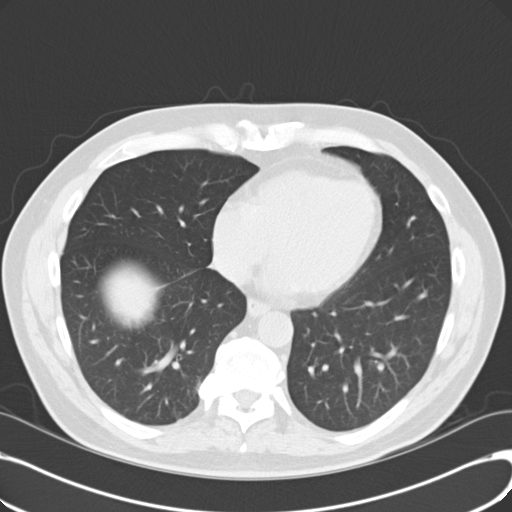
[im 29/63  lung]
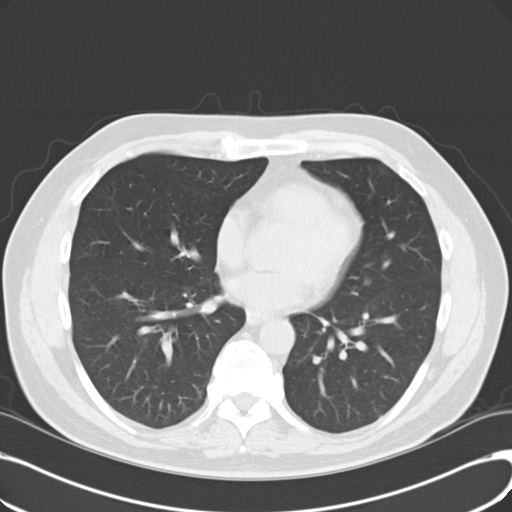
[im 34/63  lung]
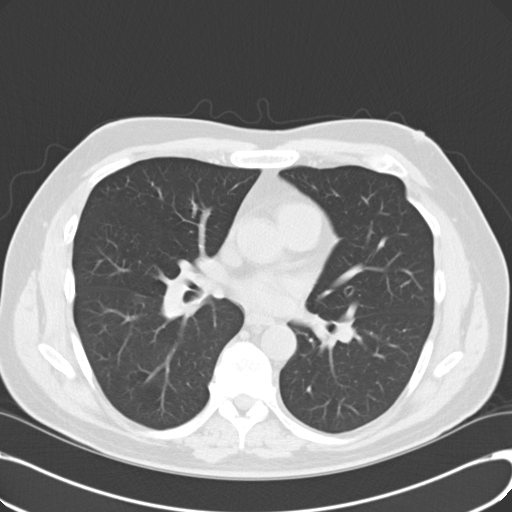
[im 39/63  lung]
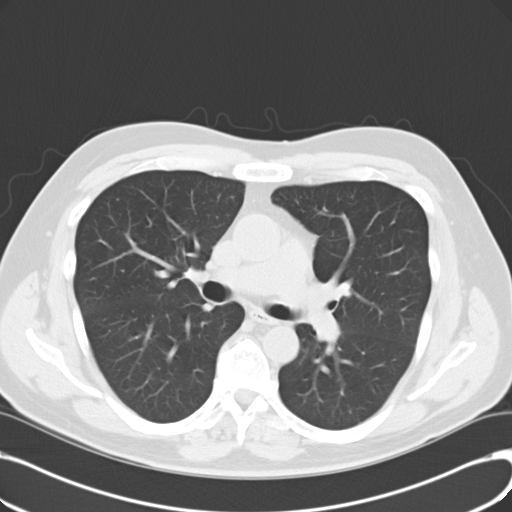
[im 43/63  mediastinal]
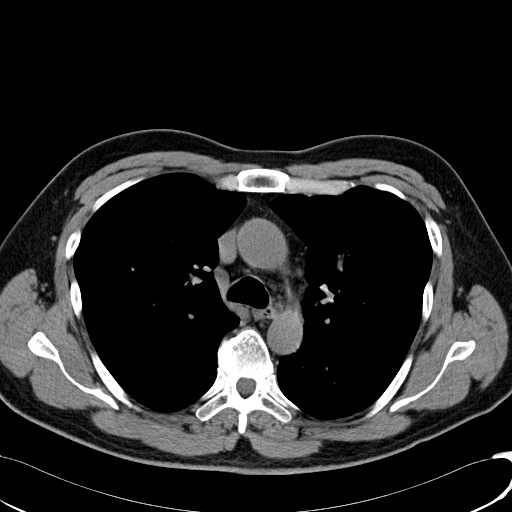
[im 43/63  lung]
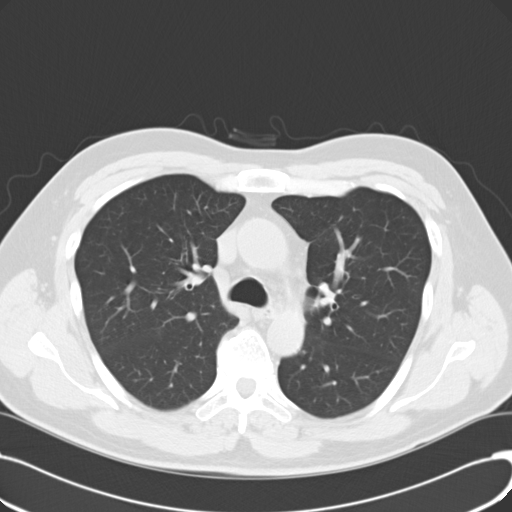
[im 48/63  lung]
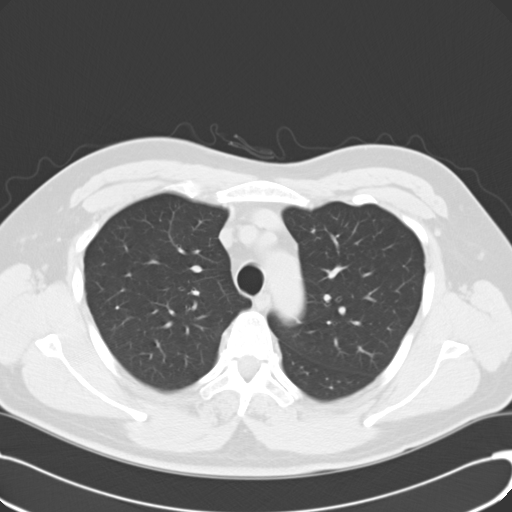
[im 53/63  lung]
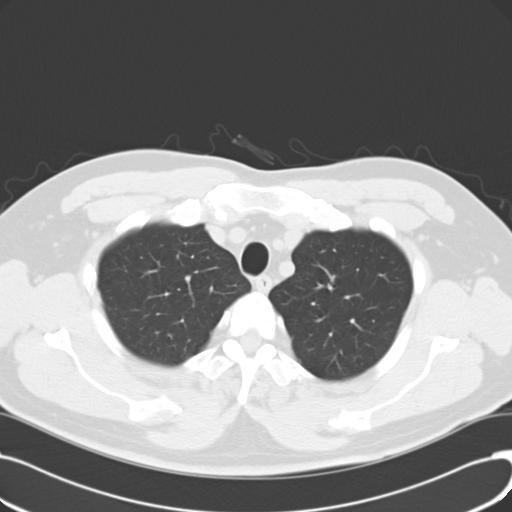
[im 58/63  lung]
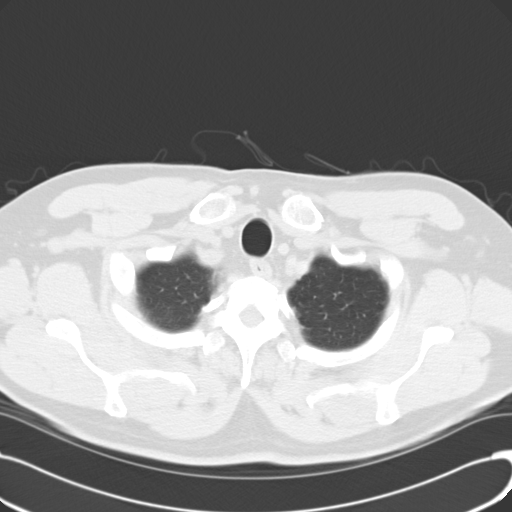

[Series 602: cor · coronal · 0.71mm/px · 3 of 110 slices shown]
[im 22/110  lung]
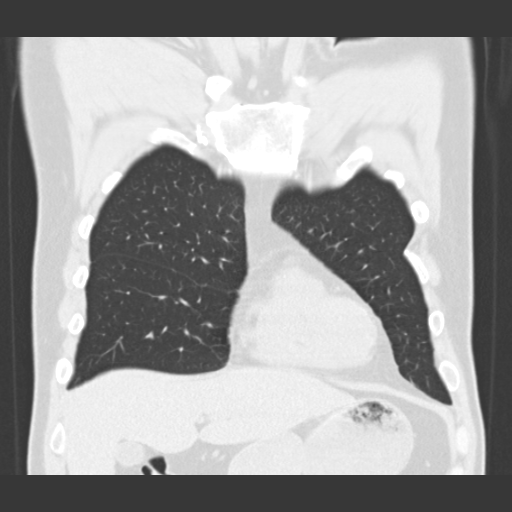
[im 44/110  lung]
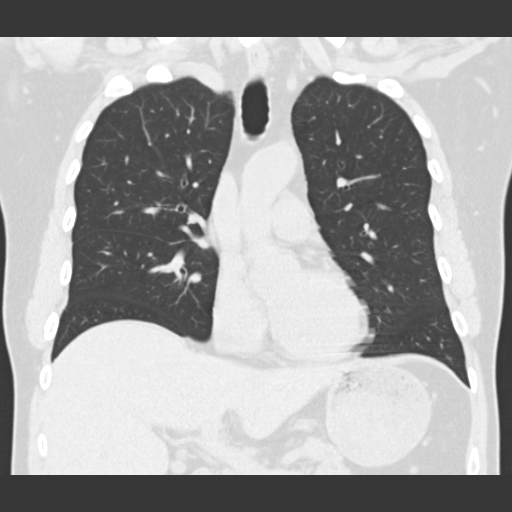
[im 66/110  lung]
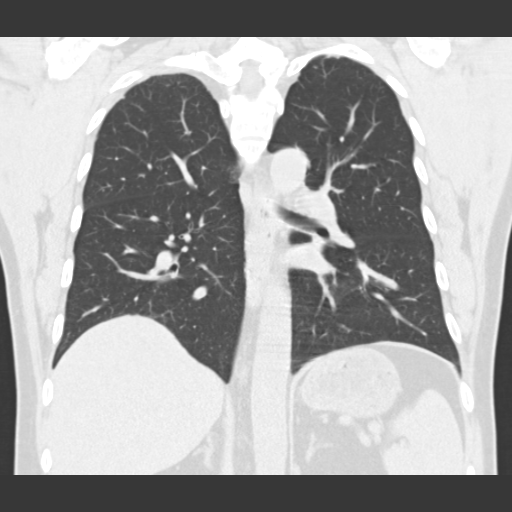

[15 of 36 positions shown; findings below may reference images not displayed]

FINDINGS: Stable 0.4 x 0.3 cm right middle lobe pulmonary nodule is
noncalcified. No additional nodules observed. Minimal biapical
pleural parenchymal scarring. No thoracic adenopathy or additional
significant findings.
IMPRESSION: 1. 3 x 4 mm right middle lobe pulmonary nodule, unchanged from
01/14/2013. This documents 6 months of stability. This lesion is
statistically highly likely to be benign. Given the history of
testicular malignancy, CT chest without contrast an 12-18 months
should be considered to firmly established long-term stability.
Alternatively, if the patient has remote imaging through this region
which could be provided, we are happy to compare for stability.

## 2014-03-11 ENCOUNTER — Telehealth: Payer: Self-pay | Admitting: Pulmonary Disease

## 2014-03-11 NOTE — Telephone Encounter (Signed)
atc na x1 

## 2014-03-12 NOTE — Telephone Encounter (Signed)
Pt c/o dry cough, chest pain (discomfort/burning) x 7-10days Pt states that he feels some mucus in back of throat but is unable to get the mucus up and out.  Using OTC [facility] allergy pills for PND and cough. Pt states that he notices symptoms to be increased with air conditioning.  No Known Allergies [facility] [address]  Please advise Dr Halford Chessman. Thanks.

## 2014-03-12 NOTE — Telephone Encounter (Signed)
Pt is aware of recs from [facility]; he will try OTC Nasacort for few days-if no better then he will call for an OV.

## 2014-03-12 NOTE — Telephone Encounter (Signed)
Likely related to allergies.  He can try OTC nasacort two sprays in each nostril daily until symptoms improve.  If this does not improve symptoms in next few days, then he would need to come in for evaluation.

## 2014-05-27 ENCOUNTER — Ambulatory Visit (INDEPENDENT_AMBULATORY_CARE_PROVIDER_SITE_OTHER)
Admission: RE | Admit: 2014-05-27 | Discharge: 2014-05-27 | Disposition: A | Discharge status: Home / Self Care | Payer: 59 | Source: Ambulatory Visit | Attending: Internal Medicine | Admitting: Internal Medicine

## 2014-05-27 ENCOUNTER — Ambulatory Visit (INDEPENDENT_AMBULATORY_CARE_PROVIDER_SITE_OTHER): Payer: 59 | Admitting: Internal Medicine

## 2014-05-27 ENCOUNTER — Encounter: Payer: Self-pay | Admitting: Internal Medicine

## 2014-05-27 VITALS — BP 140/88 | HR 80 | Temp 98.4°F | Ht 75.0 in | Wt 203.0 lb

## 2014-05-27 DIAGNOSIS — R0600 Dyspnea, unspecified: Secondary | ICD-10-CM

## 2014-05-27 DIAGNOSIS — R0609 Other forms of dyspnea: Secondary | ICD-10-CM

## 2014-05-27 DIAGNOSIS — R0989 Other specified symptoms and signs involving the circulatory and respiratory systems: Secondary | ICD-10-CM

## 2014-05-27 DIAGNOSIS — R079 Chest pain, unspecified: Secondary | ICD-10-CM

## 2014-05-27 MED ORDER — NEBIVOLOL HCL 5 MG PO TABS: 5.0000 mg | ORAL_TABLET | Freq: Every day | ORAL | Status: DC

## 2014-05-27 NOTE — Patient Instructions (Addendum)
Try prilosec 20mg   Take 30-60 min before first meal of the day and Pepcid 20 mg one bedtime until cough is completely gone for at least a week without the need for cough suppression.  GERD (REFLUX)  is an extremely common cause of respiratory symptoms, many times with no significant heartburn at all.    It can be treated with medication, but also with lifestyle changes including avoidance of late meals, excessive alcohol, smoking cessation, and avoid fatty foods, chocolate, peppermint, colas, red wine, and acidic juices such as orange juice.  NO MINT OR MENTHOL PRODUCTS SO NO COUGH DROPS  USE SUGARLESS CANDY INSTEAD (jolley ranchers or Stover's)  NO OIL BASED VITAMINS - use powdered substitutes.   Please remember to go to the  x-ray department downstairs for your tests - we will call you with the results when they are available.    Start bystolic 5 mg daily until you see your primary doctor.

## 2014-05-27 NOTE — Assessment & Plan Note (Signed)
-   05/27/2014  Walked RA x 3 laps @ 185 ft each stopped due to end of study, no cp or ekg changes/no desat   Symptoms are markedly disproportionate to objective findings and not clear this is a lung problem but pt does appear to have difficult airway management issues. DDX of  difficult airways management all start with A and  include Adherence, Ace Inhibitors, Acid Reflux, Active Sinus Disease, Alpha 1 Antitripsin deficiency, Anxiety masquerading as Airways dz,  ABPA,  allergy(esp in young), Aspiration (esp in elderly), Adverse effects of DPI,  Active smokers, plus two Bs  = Bronchiectasis and Beta blocker use..and one C= CHF  ? Acid (or non-acid) GERD > always difficult to exclude as up to 75% of pts in some series report no assoc GI/ Heartburn symptoms> rec max (24h)  acid suppression and diet restrictions/ reviewed and instructions given in writing.   ? Anxiety > dx of exclusion  ? Cardiac component > see chest pain

## 2014-05-27 NOTE — Assessment & Plan Note (Addendum)
New onset late June 2015  - not reproduced with exertion 05/27/2014   The more I discussed the possibility that his ex cp was cardiac the more the hx changed and he declined referral to cardiology but did agree to bystolic 5 mg daily and re-eval by primary care w/in next 3 weeks  In meantime rx for gerd as well as some of his complaints are quite atypical for angina.

## 2014-05-27 NOTE — Progress Notes (Addendum)
Chief Complaint  Patient presents with  . Follow-up    Review CT results    History of Present Illness: Seth Greer is a 53 y.o. male never smoker with pulmonary nodule and hx of testicular cancer.  07/11/2013 He is here to review CT chest. rec repeat 12 m  05/27/2014 acute  ov/Seth Greer re: new onset ex cp first running to get out of rain x 3 weeks Chief Complaint  Patient presents with  . Acute Visit    [facility] pt--c/o: with any exertion experiencing chest pain over the past few months and some cough   new onset chest discomfort p one flight of steps, assoc with  sob,  no diaphoresis or nausea - resolves w/in few min at rest Similar  chest discomfort occurs  sitting whenever in A/C and temporarily better p clearing his throat. Both chest discomforts started about the same time with minimal progression severlity  No obvious other patterns in day to day or daytime variabilty or assoc excess or purulent sputum or   chest tightness, subjective wheeze overt sinus or hb symptoms. No unusual exp hx or h/o childhood pna/ asthma or knowledge of premature birth.  Sleeping ok without nocturnal  or early am exacerbation  of respiratory  c/o's or need for noct saba. Also denies any obvious fluctuation of symptoms with weather or environmental changes or other aggravating or alleviating factors except as outlined above   Current Medications, Allergies, Complete Past Medical History, Past Surgical History, Family History, and Social History were reviewed in [employer] record.  ROS  The following are not active complaints unless bolded sore throat, dysphagia, dental problems, itching, sneezing,  nasal congestion or excess/ purulent secretions, ear ache,   fever, chills, sweats, unintended wt loss, pleuritic  cp, hemoptysis,  orthopnea pnd or leg swelling, presyncope, palpitations, heartburn, abdominal pain, anorexia, nausea, vomiting, diarrhea  or change in bowel or urinary habits, change in  stools or urine, dysuria,hematuria,  rash, arthralgias, visual complaints, headache, numbness weakness or ataxia or problems with walking or coordination,  change in mood/affect or memory.                  TESTS: CT chest 01/08/13 >> 4 mm RML nodule CT chest 07/09/13 >> no change RML nodule  Seth Greer  has a past medical history of Testicular cancer.  Seth Greer  has past surgical history that includes Testicle surgery. No Known Allergies   Physical Exam:  General - No distress ENT - No sinus tenderness, no oral exudate, no LAN Cardiac - s1s2 regular, no murmur Chest - No wheeze/rales/dullness Back - No focal tenderness Abd - Soft, non-tender Ext - No edema Neuro - Normal strength Skin - No rashes Psych - normal mood, and behavior     CXR  05/27/2014 : There is no acute cardiopulmonary abnormality. Mild hyperinflation may reflect deep inspiratory effort or COPD. There are no findings suspicious for metastatic disease to the lungs.    Assessment/Plan:

## 2014-05-28 NOTE — Progress Notes (Signed)
Quick Note:  LMTCB ______ 

## 2014-05-28 NOTE — Progress Notes (Signed)
Quick Note:  Spoke with pt and notified of results per Dr. Wert. Pt verbalized understanding and denied any questions.  ______ 

## 2014-07-15 ENCOUNTER — Ambulatory Visit (INDEPENDENT_AMBULATORY_CARE_PROVIDER_SITE_OTHER)
Admission: RE | Admit: 2014-07-15 | Discharge: 2014-07-15 | Disposition: A | Discharge status: Home / Self Care | Payer: 59 | Source: Ambulatory Visit | Attending: Pulmonary Disease | Admitting: Pulmonary Disease

## 2014-07-15 DIAGNOSIS — R911 Solitary pulmonary nodule: Secondary | ICD-10-CM

## 2014-07-23 ENCOUNTER — Other Ambulatory Visit: Payer: Self-pay | Admitting: Dermatology

## 2014-07-23 ENCOUNTER — Encounter: Payer: Self-pay | Admitting: Pulmonary Disease

## 2014-07-23 ENCOUNTER — Ambulatory Visit (INDEPENDENT_AMBULATORY_CARE_PROVIDER_SITE_OTHER): Payer: 59 | Admitting: Pulmonary Disease

## 2014-07-23 VITALS — BP 128/82 | HR 78 | Ht 75.0 in | Wt 205.0 lb

## 2014-07-23 DIAGNOSIS — R911 Solitary pulmonary nodule: Secondary | ICD-10-CM

## 2014-07-23 DIAGNOSIS — R059 Cough, unspecified: Secondary | ICD-10-CM

## 2014-07-23 DIAGNOSIS — R05 Cough: Secondary | ICD-10-CM

## 2014-07-23 DIAGNOSIS — R053 Chronic cough: Secondary | ICD-10-CM

## 2014-07-23 MED ORDER — ALBUTEROL SULFATE 108 (90 BASE) MCG/ACT IN AEPB
2.0000 | INHALATION_SPRAY | Freq: Four times a day (QID) | RESPIRATORY_TRACT | Status: DC | PRN
Start: 2014-07-23 — End: 2014-07-25

## 2014-07-23 NOTE — Assessment & Plan Note (Signed)
Stable on most recent CT chest, consistent with benign lesion.  No additional radiographic follow up needed for this.

## 2014-07-23 NOTE — Assessment & Plan Note (Signed)
Symptoms suggestive of intermittent asthma, and he has history of allergies.  Will arrange for PFT's to further assess and give him trial of proair respiclick.

## 2014-07-23 NOTE — Progress Notes (Signed)
Chief Complaint  Patient presents with  . Follow-up    Review CT chest 07/2014.     History of Present Illness: Seth Greer is a 53 y.o. male never smoker with pulmonary nodule and hx of testicular cancer.  He is here to review his CT chest from earlier this month.  No change to RML nodule and no other worrisome findings.  He was seen over the Summer by Dr. Melvyn Novas for cough.  This was an issue last year when I saw him also.  He gets this when he is exercising, and when there is change in temperature.  He also notices problem in hot/humid weather and around air conditioning.  He gets allergies and has been sneezing more frequently.  He does not usually bring up sputum, and has not noticed wheeze.   TESTS: CT chest 01/08/13 >> 4 mm RML nodule CT chest 07/09/13 >> no change RML nodule CT chest 07/15/14 >> no change to RML nodule  PMHx, PSHx, Medications, Allergies, Fhx, Shx reviewed.  Physical Exam:  General - No distress ENT - No sinus tenderness, no oral exudate, no LAN Cardiac - s1s2 regular, no murmur Chest - No wheeze/rales/dullness Back - No focal tenderness Abd - Soft, non-tender Ext - No edema Neuro - Normal strength Skin - No rashes Psych - normal mood, and behavior   Assessment/Plan:  Chesley Mires, MD [facility]/Sleep Pager:  [phone]

## 2014-07-23 NOTE — Patient Instructions (Addendum)
Proair respiclick 2 puffs every 6 hours as needed for cough, wheeze, or chest congestion Will schedule breathing test (PFT) Follow up in 6 weeks

## 2014-07-25 ENCOUNTER — Other Ambulatory Visit: Payer: Self-pay | Admitting: Pulmonary Disease

## 2014-07-25 MED ORDER — ALBUTEROL SULFATE 108 (90 BASE) MCG/ACT IN AEPB: 2.0000 | INHALATION_SPRAY | Freq: Four times a day (QID) | RESPIRATORY_TRACT | Status: DC | PRN

## 2014-09-05 ENCOUNTER — Ambulatory Visit (INDEPENDENT_AMBULATORY_CARE_PROVIDER_SITE_OTHER): Payer: 59 | Admitting: Pulmonary Disease

## 2014-09-05 ENCOUNTER — Encounter: Payer: Self-pay | Admitting: Pulmonary Disease

## 2014-09-05 VITALS — BP 128/80 | HR 78 | Temp 97.8°F | Ht 75.0 in | Wt 209.5 lb

## 2014-09-05 DIAGNOSIS — R05 Cough: Secondary | ICD-10-CM

## 2014-09-05 DIAGNOSIS — R053 Chronic cough: Secondary | ICD-10-CM

## 2014-09-05 DIAGNOSIS — J4520 Mild intermittent asthma, uncomplicated: Secondary | ICD-10-CM

## 2014-09-05 LAB — PULMONARY FUNCTION TEST
DL/VA % pred: 102 %
DL/VA: 5 ml/min/mmHg/L
DLCO unc % pred: 93 %
DLCO unc: 36.47 ml/min/mmHg
FEF 25-75 Post: 3.36 L/sec
FEF 25-75 Pre: 2.73 L/sec
FEF2575-%Change-Post: 23 %
FEF2575-%Pred-Post: 90 %
FEF2575-%Pred-Pre: 73 %
FEV1-%Change-Post: 6 %
FEV1-%Pred-Post: 87 %
FEV1-%Pred-Pre: 82 %
FEV1-Post: 3.89 L
FEV1-Pre: 3.65 L
FEV1FVC-%Change-Post: 6 %
FEV1FVC-%Pred-Pre: 95 %
FEV6-%Change-Post: 0 %
FEV6-%Pred-Post: 88 %
FEV6-%Pred-Pre: 88 %
FEV6-Post: 4.93 L
FEV6-Pre: 4.93 L
FEV6FVC-%Change-Post: 0 %
FEV6FVC-%Pred-Post: 103 %
FEV6FVC-%Pred-Pre: 103 %
FVC-%Change-Post: 0 %
FVC-%Pred-Post: 85 %
FVC-%Pred-Pre: 85 %
FVC-Post: 4.96 L
FVC-Pre: 4.96 L
Post FEV1/FVC ratio: 78 %
Post FEV6/FVC ratio: 100 %
Pre FEV1/FVC ratio: 74 %
Pre FEV6/FVC Ratio: 100 %
RV % pred: 101 %
RV: 2.44 L
TLC % pred: 91 %
TLC: 7.28 L

## 2014-09-05 MED ORDER — ALBUTEROL SULFATE 108 (90 BASE) MCG/ACT IN AEPB: 2.0000 | INHALATION_SPRAY | Freq: Four times a day (QID) | RESPIRATORY_TRACT | Status: AC | PRN

## 2014-09-05 NOTE — Progress Notes (Signed)
PFT done today. 

## 2014-09-05 NOTE — Progress Notes (Signed)
Chief Complaint  Patient presents with  . Follow-up    Review PFT.     History of Present Illness: Seth Greer is a 53 y.o. male never smoker with cough likely from asthma.  He has hx of benign lung nodules and testicular cancer.  He is here to review his PFT.  This was normal.  He is doing better with his cough.  He uses albuterol about once per week.  He is not having sinus congestion or reflux.   TESTS: CT chest 01/08/13 >> 4 mm RML nodule CT chest 07/09/13 >> no change RML nodule CT chest 07/15/14 >> no change to RML nodule PFT 09/05/14 >> FEV1 3.89 (87%), FEV1% 78, TLC 7.28 (91%), DLCO 93%, no BD  PMHx, PSHx, Medications, Allergies, Fhx, Shx reviewed.  Physical Exam:  General - No distress ENT - No sinus tenderness, no oral exudate, no LAN Cardiac - s1s2 regular, no murmur Chest - No wheeze/rales/dullness Back - No focal tenderness Abd - Soft, non-tender Ext - No edema Neuro - Normal strength Skin - No rashes Psych - normal mood, and behavior   Assessment/Plan:  Chesley Mires, MD [facility]Critical [facility]/Sleep Pager:  [phone]

## 2014-09-05 NOTE — Assessment & Plan Note (Signed)
He is to continue prn albuterol.  Discussed symptoms to monitor for and when to use his inhaler.    Discussed option of flu shot >> he declined at this time.

## 2014-09-05 NOTE — Patient Instructions (Signed)
Follow up in 6 months 

## 2014-10-03 IMAGING — CR DG CHEST 2V
2 series · 2 of 2 positions shown · non-contrast
Comparison: CT scan of the chest July 09, 2013

CLINICAL DATA: Shortness of breath and chest discomfort with
history of pulmonary nodule

EXAM:
CHEST  2 VIEW

[view not recorded (1 of 2)]
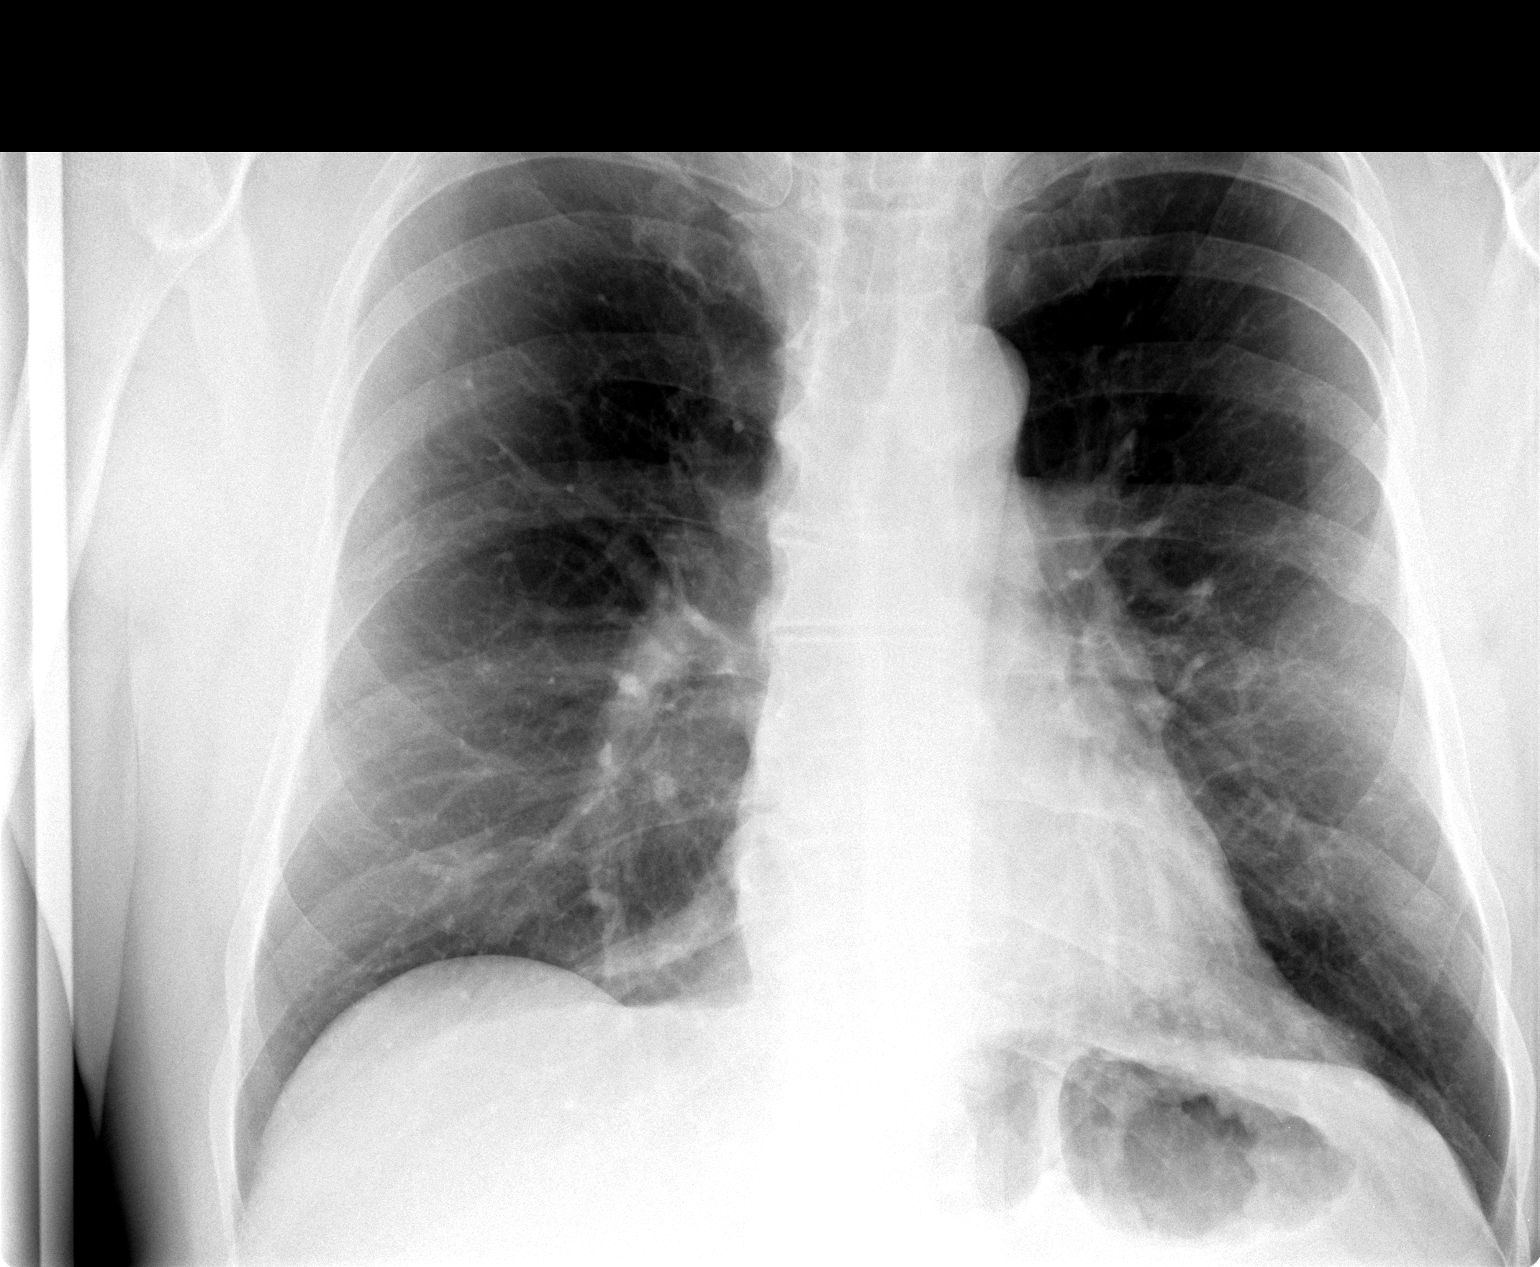

[view not recorded (2 of 2)]
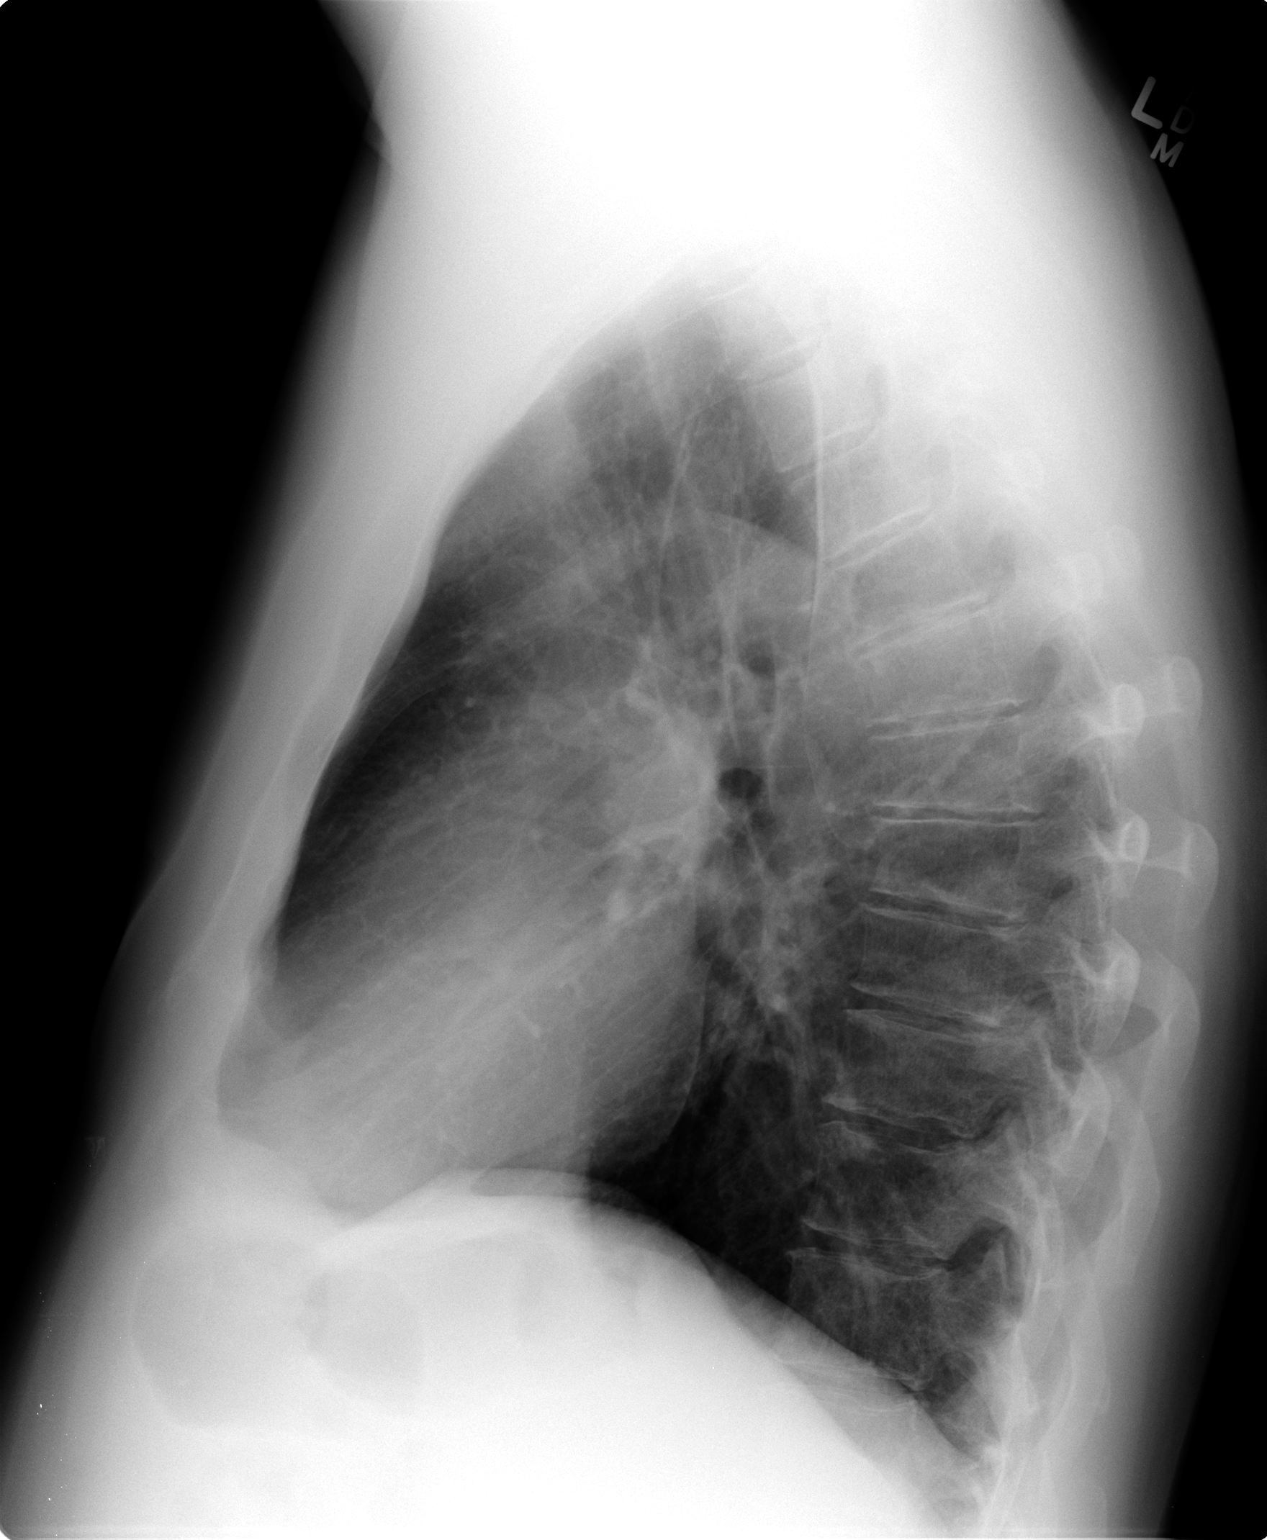

[2 of 2 positions shown; findings below may reference images not displayed]

FINDINGS: The lungs are well-expanded. There is no focal infiltrate. There is
no pleural effusion or pneumothorax. No suspicious pulmonary
parenchymal masses are demonstrated. The heart and mediastinal
structures are normal. The bony thorax is unremarkable.
IMPRESSION: There is no acute cardiopulmonary abnormality. Mild hyperinflation
may reflect deep inspiratory effort or COPD. There are no findings
suspicious for metastatic disease to the lungs.

## 2022-01-11 ENCOUNTER — Encounter: Payer: Self-pay | Admitting: Gastroenterology

## 2024-04-25 ENCOUNTER — Ambulatory Visit: Payer: Self-pay | Admitting: Physician Assistant

## 2024-08-15 ENCOUNTER — Other Ambulatory Visit: Payer: Self-pay

## 2024-08-15 ENCOUNTER — Emergency Department (HOSPITAL_BASED_OUTPATIENT_CLINIC_OR_DEPARTMENT_OTHER)
Admission: EM | Admit: 2024-08-15 | Discharge: 2024-08-15 | Disposition: A | Discharge status: Home / Self Care | Attending: Emergency Medicine | Admitting: Emergency Medicine

## 2024-08-15 DIAGNOSIS — S61411A Laceration without foreign body of right hand, initial encounter: Secondary | ICD-10-CM | POA: Insufficient documentation

## 2024-08-15 DIAGNOSIS — W268XXA Contact with other sharp object(s), not elsewhere classified, initial encounter: Secondary | ICD-10-CM | POA: Diagnosis not present

## 2024-08-15 DIAGNOSIS — S6991XA Unspecified injury of right wrist, hand and finger(s), initial encounter: Secondary | ICD-10-CM | POA: Diagnosis present

## 2024-08-15 MED ORDER — LIDOCAINE HCL (PF) 1 % IJ SOLN
5.0000 mL | Freq: Once | INTRAMUSCULAR | Status: AC
  Administered 2024-08-15: 5 mL
  Filled 2024-08-15: qty 5

## 2024-08-15 NOTE — Discharge Instructions (Signed)
 Sutures can be removed in 7 days by your doctor. Ask him about your tetanus status at that time. Go sooner with any sign of infection - redness, swelling, increasing pain or drainage from the wound. Tylenol and/or ibuprofen for discomfort.

## 2024-08-15 NOTE — ED Notes (Signed)
 Reviewed AVS/discharge instruction with patient. Time allotted for and all questions answered. Patient is agreeable for d/c and escorted to ed exit by staff.

## 2024-08-15 NOTE — ED Provider Notes (Signed)
 [place] EMERGENCY DEPARTMENT AT [facility] Provider Note   CSN: [id number] Arrival date & time: 08/15/24  2007     Patient presents with: Puncture Wound   Seth Greer is a 63 y.o. male.   Patient to ED with wound to right hand after contact with sharp metal Halloween decoration while shopping. No other injury. Tetanus is out-of-date.  The history is provided by the patient and the spouse. No language interpreter was used.       Prior to Admission medications   Medication Sig Start Date End Date Taking? Authorizing Provider  Albuterol  Sulfate (PROAIR  RESPICLICK) 108 (90 BASE) MCG/ACT AEPB Inhale 2 puffs into the lungs every 6 (six) hours as needed. 09/05/14   Sood, Vineet, MD  ibuprofen (ADVIL,MOTRIN) 200 MG tablet Take 200 mg by mouth as needed.    [provider]  zolpidem  (AMBIEN ) 10 MG tablet Take 1 tablet (10 mg total) by mouth at bedtime as needed for sleep. 12/22/11   Jakie Alm SAUNDERS, MD    Allergies: Patient has no known allergies.    Review of Systems  Updated Vital Signs BP (!) 173/89 (BP Location: Right Arm)   Pulse 72   Temp 97.9 F (36.6 C)   Resp 18   Ht 6' 3 (1.905 m)   Wt 97.5 kg   SpO2 99%   BMI 26.87 kg/m   Physical Exam Vitals and nursing note reviewed.  Constitutional:      Appearance: He is well-developed.  Pulmonary:     Effort: Pulmonary effort is normal.  Musculoskeletal:        General: Normal range of motion.     Cervical back: Normal range of motion.     Comments: 1 cm laceration to interphalangeal space between thumb and index finger of right hand. No active bleeding. FROM thumb and index without tendon deficits.   Skin:    General: Skin is warm and dry.  Neurological:     Mental Status: He is alert and oriented to person, place, and time.     (all labs ordered are listed, but only abnormal results are displayed) Labs Reviewed - No data to display  EKG: None  Radiology: No results  found.   .Laceration Repair  Date/Time: 08/15/2024 10:04 PM  Performed by: Odell Balls, PA-C Authorized by: Odell Balls, PA-C   Consent:    Consent obtained:  Verbal   Consent given by:  Patient Universal protocol:    Procedure explained and questions answered to patient or proxy's satisfaction: yes     Patient identity confirmed:  Verbally with patient Anesthesia:    Anesthesia method:  Local infiltration   Local anesthetic:  Lidocaine 1% w/o epi Laceration details:    Location:  Hand   Hand location:  R palm   Length (cm):  1 Pre-procedure details:    Preparation:  Patient was prepped and draped in usual sterile fashion Treatment:    Area cleansed with:  Povidone-iodine and saline   Amount of cleaning:  Standard Skin repair:    Repair method:  Sutures   Suture size:  4-0   Suture material:  Nylon   Suture technique:  Simple interrupted   Number of sutures:  3 Approximation:    Approximation:  Close Repair type:    Repair type:  Simple Post-procedure details:    Dressing:  Antibiotic ointment and non-adherent dressing   Procedure completion:  Tolerated well, no immediate complications    Medications Ordered in the ED  lidocaine (PF) (XYLOCAINE) 1 % injection 5 mL (has no administration in time range)    Clinical Course as of 08/15/24 2207  Thu Aug 15, 2024  2205 Simple laceration to right hand repaired as per procedure note. The patient declines tetanus injection.  [SU]    Clinical Course User Index [SU] Odell Balls, PA-C                                 Medical Decision Making Risk Prescription drug management.        Final diagnoses:  Laceration of right hand without foreign body, initial encounter    ED Discharge Orders     None          Odell Balls RIGGERS 08/15/24 2207    Randol Simmonds, MD 08/16/24 [phone]

## 2024-08-15 NOTE — ED Triage Notes (Signed)
 Pt POV after puncturing R hand with metal halloween decoration, bleeding controlled PTA.

## 2024-09-09 ENCOUNTER — Encounter: Payer: Self-pay | Admitting: Radiology
